# Patient Record
Sex: Female | Born: 1990 | Race: Black or African American | Hispanic: No | Marital: Single | Smoking: Never smoker
Health system: Southern US, Community
[De-identification: ages and names within clinical notes are randomized; demographics above are authoritative.]

## PROBLEM LIST (undated history)

## (undated) DIAGNOSIS — D259 Leiomyoma of uterus, unspecified: Secondary | ICD-10-CM

## (undated) DIAGNOSIS — G43909 Migraine, unspecified, not intractable, without status migrainosus: Secondary | ICD-10-CM

## (undated) DIAGNOSIS — F410 Panic disorder [episodic paroxysmal anxiety] without agoraphobia: Secondary | ICD-10-CM

## (undated) HISTORY — PX: NO PAST SURGERIES: SHX2092

---

## 2015-10-08 ENCOUNTER — Emergency Department (HOSPITAL_COMMUNITY)
Admission: EM | Admit: 2015-10-08 | Discharge: 2015-10-08 | Disposition: A | Discharge status: Home / Self Care | Payer: Self-pay | Attending: Emergency Medicine | Admitting: Emergency Medicine

## 2015-10-08 ENCOUNTER — Encounter (HOSPITAL_COMMUNITY): Payer: Self-pay | Admitting: Emergency Medicine

## 2015-10-08 ENCOUNTER — Emergency Department (HOSPITAL_COMMUNITY): Payer: Self-pay

## 2015-10-08 DIAGNOSIS — R002 Palpitations: Secondary | ICD-10-CM | POA: Insufficient documentation

## 2015-10-08 DIAGNOSIS — Z86018 Personal history of other benign neoplasm: Secondary | ICD-10-CM | POA: Insufficient documentation

## 2015-10-08 DIAGNOSIS — F419 Anxiety disorder, unspecified: Secondary | ICD-10-CM | POA: Insufficient documentation

## 2015-10-08 DIAGNOSIS — Z8679 Personal history of other diseases of the circulatory system: Secondary | ICD-10-CM | POA: Insufficient documentation

## 2015-10-08 DIAGNOSIS — Z3202 Encounter for pregnancy test, result negative: Secondary | ICD-10-CM | POA: Insufficient documentation

## 2015-10-08 DIAGNOSIS — R0602 Shortness of breath: Secondary | ICD-10-CM | POA: Insufficient documentation

## 2015-10-08 HISTORY — DX: Leiomyoma of uterus, unspecified: D25.9

## 2015-10-08 HISTORY — DX: Panic disorder (episodic paroxysmal anxiety): F41.0

## 2015-10-08 HISTORY — DX: Migraine, unspecified, not intractable, without status migrainosus: G43.909

## 2015-10-08 LAB — URINALYSIS, ROUTINE W REFLEX MICROSCOPIC
Bilirubin Urine: NEGATIVE
Glucose, UA: NEGATIVE mg/dL
KETONES UR: NEGATIVE mg/dL
Leukocytes, UA: NEGATIVE
Nitrite: NEGATIVE
PROTEIN: NEGATIVE mg/dL
SPECIFIC GRAVITY, URINE: 1.019 (ref 1.005–1.030)
pH: 6 (ref 5.0–8.0)

## 2015-10-08 LAB — T4, FREE: FREE T4: 0.86 ng/dL (ref 0.61–1.12)

## 2015-10-08 LAB — URINE MICROSCOPIC-ADD ON

## 2015-10-08 LAB — I-STAT CHEM 8, ED
BUN: 5 mg/dL — AB (ref 6–20)
CHLORIDE: 104 mmol/L (ref 101–111)
Calcium, Ion: 1.21 mmol/L (ref 1.12–1.23)
Creatinine, Ser: 0.8 mg/dL (ref 0.44–1.00)
GLUCOSE: 101 mg/dL — AB (ref 65–99)
HCT: 36 % (ref 36.0–46.0)
Hemoglobin: 12.2 g/dL (ref 12.0–15.0)
Potassium: 3.3 mmol/L — ABNORMAL LOW (ref 3.5–5.1)
Sodium: 140 mmol/L (ref 135–145)
TCO2: 22 mmol/L (ref 0–100)

## 2015-10-08 LAB — TSH: TSH: 3.792 u[IU]/mL (ref 0.350–4.500)

## 2015-10-08 LAB — POC URINE PREG, ED: PREG TEST UR: NEGATIVE

## 2015-10-08 MED ORDER — METOPROLOL TARTRATE 25 MG PO TABS: 100.0000 mg | ORAL_TABLET | Freq: Two times a day (BID) | ORAL | Status: DC

## 2015-10-08 MED ORDER — POTASSIUM CHLORIDE CRYS ER 20 MEQ PO TBCR
40.0000 meq | EXTENDED_RELEASE_TABLET | Freq: Once | ORAL | Status: AC
  Administered 2015-10-08: 40 meq via ORAL
  Filled 2015-10-08: qty 2

## 2015-10-08 MED ORDER — METOPROLOL TARTRATE 1 MG/ML IV SOLN
5.0000 mg | Freq: Once | INTRAVENOUS | Status: AC
  Administered 2015-10-08: 5 mg via INTRAVENOUS
  Filled 2015-10-08: qty 5

## 2015-10-08 NOTE — ED Notes (Signed)
Patient is alert and oriented x3.  She was given DC instructions and follow up visit instructions.  Patient gave verbal understanding. She was DC ambulatory under her own power to home.  V/S stable.  He was not showing any signs of distress on DC 

## 2015-10-08 NOTE — ED Notes (Signed)
Patient is drinking water from water bottle to help try to urinate per md orders

## 2015-10-08 NOTE — ED Provider Notes (Signed)
CSN: [id number]     Arrival date & time 10/08/15  0202 History   First MD Initiated Contact with Patient 10/08/15 0206     Chief Complaint  Patient presents with  . Palpitations     (Consider location/radiation/quality/duration/timing/severity/associated sxs/prior Treatment) HPI   Patient is a 25 year old female with history of uterine fibroids, migraines and panic attacks, she presents to emergency department with 6 hours of rapid heart rate that began tonight at 8 pm.  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  She states she was able to feel it throughout her whole body, and she describes it as if "her heart is going to pound through her chest."  She has associated intermittent shooting CP located on the sides of her chest, without radiation and w/o aggrivating or alleviating factors, not reproducible with exertion.  She denies SOB, and states with other anxiety attacks the patient felt short of breath and would hyperventilate, she did not have the symptoms today.  She denies any lower extremity edema, history of blood clots, recent travel, stasis or injury, lightheadedness. Multiple family members have history of thyroid disease, she has her thyroid tested annually.  She denies any recent illness.  She denies possibility of dehydration, states that she drinks bottled water throughout the day.  She has a history of abnormal periods stating that she had 2 periods in the month of December, 1 at the beginning of January and she just finished her period this week. She currently rates anxiety 5/10.  She denies SI, HI and AVH.  She does not take any meds for anxiety.    Past Medical History  Diagnosis Date  . Panic attack   . Migraine   . Fibroid, uterine    History reviewed. No pertinent past surgical history. History reviewed. No pertinent family history. Social History  Substance Use Topics  . Smoking status: Never Smoker   .  Smokeless tobacco: None  . Alcohol Use: No   OB History    No data available     Review of Systems  Constitutional: Negative.   HENT: Negative.   Eyes: Negative.   Respiratory: Positive for shortness of breath. Negative for cough and wheezing.   Cardiovascular: Positive for palpitations. Negative for leg swelling.  Gastrointestinal: Negative.   Endocrine: Negative.   Genitourinary: Negative.   Musculoskeletal: Negative.   Skin: Negative.   Neurological: Negative for dizziness, syncope, weakness, light-headedness and headaches.  Hematological: Negative.   Psychiatric/Behavioral: Negative for suicidal ideas, hallucinations, self-injury and dysphoric mood. The patient is nervous/anxious.     Allergies  Review of patient's allergies indicates no known allergies.  Home Medications   Prior to Admission medications   Medication Sig Start Date End Date Taking? Authorizing Provider  metoprolol (LOPRESSOR) 25 MG tablet Take 4 tablets (100 mg total) by mouth 2 (two) times daily. 10/08/15   Jola Schmidt, MD   BP 131/83 mmHg  Pulse 67  Temp(Src) 97.4 F (36.3 C) (Axillary)  Resp 10  Ht 5' (1.524 m)  Wt 115.214 kg  BMI 49.61 kg/m2  SpO2 100%  LMP 10/06/2015 Physical Exam  Constitutional: She is oriented to person, place, and time. She appears well-developed and well-nourished. No distress.  HENT:  Head: Normocephalic and atraumatic.  Nose: Nose normal.  Mouth/Throat: Oropharynx is clear and moist. No oropharyngeal exudate.  Eyes: Conjunctivae and EOM are normal. Pupils are equal, round, and reactive  to light. Right eye exhibits no discharge. Left eye exhibits no discharge. No scleral icterus.  Neck: Normal range of motion. No JVD present. No tracheal deviation present. No thyromegaly present.  Cardiovascular: Normal rate, regular rhythm, normal heart sounds and intact distal pulses.  Exam reveals no gallop and no friction rub.   No murmur heard. Pulses:      Radial pulses are 2+  on the right side, and 2+ on the left side.       Dorsalis pedis pulses are 2+ on the right side, and 2+ on the left side.  No LE edema, no erythema, no tenderness with palpation of bilateral calves  Pulmonary/Chest: Effort normal and breath sounds normal. No respiratory distress. She has no wheezes. She has no rales. She exhibits no tenderness.  Abdominal: Soft. Bowel sounds are normal. She exhibits no distension and no mass. There is no tenderness. There is no rebound and no guarding.  Musculoskeletal: Normal range of motion. She exhibits no edema or tenderness.  Lymphadenopathy:    She has no cervical adenopathy.  Neurological: She is alert and oriented to person, place, and time. She has normal reflexes. No cranial nerve deficit. She exhibits normal muscle tone. Coordination normal.  Skin: Skin is warm and dry. No rash noted. She is not diaphoretic. No erythema. No pallor.  Psychiatric: She has a normal mood and affect. Her speech is normal and behavior is normal. Judgment and thought content normal. Her mood appears not anxious. Cognition and memory are normal. She does not exhibit a depressed mood.  Nursing note and vitals reviewed.   ED Course  Procedures (including critical care time) Labs Review Labs Reviewed  URINALYSIS, ROUTINE W REFLEX MICROSCOPIC (NOT AT [facility]) - Abnormal; Notable for the following:    Hgb urine dipstick TRACE (*)    All other components within normal limits  URINE MICROSCOPIC-ADD ON - Abnormal; Notable for the following:    Squamous Epithelial / LPF 0-5 (*)    Bacteria, UA RARE (*)    All other components within normal limits  I-STAT CHEM 8, ED - Abnormal; Notable for the following:    Potassium 3.3 (*)    BUN 5 (*)    Glucose, Bld 101 (*)    All other components within normal limits  TSH  T4, FREE  T3, FREE  POC URINE PREG, ED    Imaging Review Dg Chest 2 View  10/08/2015  CLINICAL DATA:  Chest tightness and palpitations. EXAM: CHEST  2 VIEW  COMPARISON:  None. FINDINGS: The heart is at the upper limits normal in size. The lungs are clear. Pulmonary vasculature is normal. No consolidation, pleural effusion, or pneumothorax. No acute osseous abnormalities are seen. IMPRESSION: Heart at the upper limits normal in size. Further evaluation could be considered with echocardiogram. No localizing process. Electronically Signed   By: Jeb Levering M.D.   On: 10/08/2015 03:00   I have personally reviewed and evaluated these images and lab results as part of my medical decision-making.   EKG Interpretation   Date/Time:  Tuesday October 08 2015 02:32:45 EST Ventricular Rate:  94 PR Interval:  142 QRS Duration: 96 QT Interval:  359 QTC Calculation: 449 R Axis:   -48 Text Interpretation:  Sinus or ectopic atrial rhythm LVH with secondary  repolarization abnormality Anterior ST elevation, probably due to LVH No  old tracing to compare Confirmed by CAMPOS  MD, KEVIN ([id number]) on 10/08/2015  3:22:46 AM      MDM  Pt with palpitations described as rapid heart rate that feels like "its going to pound through her chest" felt all over her body, she states she does not feel particularly anxious, sx began at 8pm, lasting 6 hours at the time of presentation, and have not subsided despite laying down and resting.  Will obtain EKG, chest x-ray. She has uterine fibroids and abnormal uterine bleeding, will check basic labs for anemia, will obtain thyroid labs, patient was told that they will not result tonight and she will need to follow up on them with her PCP.  Pt had abnormal EKG and CXR pertinent for heart being at upper limits of normal.  TSH was normal.  Pt was mildly hypokalemic.  The pt was presented to Dr. Venora Maples, who personally saw and evaluated the pt, he ordered IV lopressor and potassium replacement, she was watched on the monitor, and then Dr. Venora Maples discharged her with Cardiology follow up.  Please see his documentation for further  details.  Final diagnoses:  Palpitations      Delsa Grana, PA-C 10/08/15 [id number]  Jola Schmidt, MD 10/08/15 [phone]

## 2015-10-08 NOTE — Discharge Instructions (Signed)

## 2015-10-08 NOTE — ED Notes (Signed)
Pt states that states that she has had panic attacks in the past but they have not lasted this long. States she feels like her heart is racing and shaky. Alert and oriented.

## 2015-10-09 LAB — T3, FREE: T3 FREE: 3.2 pg/mL (ref 2.0–4.4)

## 2015-10-10 DIAGNOSIS — G43909 Migraine, unspecified, not intractable, without status migrainosus: Secondary | ICD-10-CM | POA: Insufficient documentation

## 2015-10-10 DIAGNOSIS — D259 Leiomyoma of uterus, unspecified: Secondary | ICD-10-CM | POA: Insufficient documentation

## 2015-10-10 DIAGNOSIS — F410 Panic disorder [episodic paroxysmal anxiety] without agoraphobia: Secondary | ICD-10-CM | POA: Insufficient documentation

## 2015-10-11 ENCOUNTER — Ambulatory Visit (INDEPENDENT_AMBULATORY_CARE_PROVIDER_SITE_OTHER): Payer: Self-pay | Admitting: Cardiology

## 2015-10-11 ENCOUNTER — Encounter: Payer: Self-pay | Admitting: Cardiology

## 2015-10-11 ENCOUNTER — Telehealth: Payer: Self-pay

## 2015-10-11 VITALS — BP 112/72 | HR 61 | Ht 60.0 in | Wt 246.0 lb

## 2015-10-11 DIAGNOSIS — R000 Tachycardia, unspecified: Secondary | ICD-10-CM

## 2015-10-11 MED ORDER — METOPROLOL TARTRATE 25 MG PO TABS: 50.0000 mg | ORAL_TABLET | Freq: Two times a day (BID) | ORAL | Status: AC

## 2015-10-11 NOTE — Telephone Encounter (Signed)
Called patient about medication change. Patient verbalized understanding and had no questions or concerns at this time.

## 2015-10-11 NOTE — Patient Instructions (Signed)
Medication Instructions:  The current medical regimen is effective;  continue present plan and medications.  Testing/Procedures: Your physician has requested that you have an echocardiogram. Echocardiography is a painless test that uses sound waves to create images of your heart. It provides your doctor with information about the size and shape of your heart and how well your heart's chambers and valves are working. This procedure takes approximately one hour. There are no restrictions for this procedure.  Follow-Up: Follow up with Dr Meda Coffee.  If you need a refill on your cardiac medications before your next appointment, please call your pharmacy.  Thank you for choosing [facility]!!

## 2015-10-11 NOTE — Progress Notes (Signed)
Cardiology Office Note  NEW PATIENT VISIT   Date:  10/11/2015   ID:  Karen Adkins, DOB 08-24-1991, MRN [id number]  PCP:  No PCP Per Patient  Cardiologist:  New Dr. Meda Adkins    Chief Complaint  Patient presents with  . Palpitations      History of Present Illness: Karen Adkins is a 25 y.o. female who presents for post ER visit for tachycardia and chest pain.   Pt seen in ER 10/08/15 for racing HR and feeling shaky. Her EKG revealed ST vs. Atrial tach though HR was not significantly elevated.  LVH.  TSH 3.792, Free T4 0.86, free T3 3.2, K+ 3.3.  Her K+ was replaced and she was given IV lopressor and HR slowed symptoms resolved.  She did have occ sharp pain in her chest in the ER.  She is now on Lopressor 100 BID with no further episodes.  No chest pain though after taking meds she may have sharp pain.   She has cut out sodas, no tobacco.  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    Past Medical History  Diagnosis Date  . Panic attack   . Migraine   . Fibroid, uterine     Past Surgical History  Procedure Laterality Date  . No past surgeries       Current Outpatient Prescriptions  Medication Sig Dispense Refill  . metoprolol (LOPRESSOR) 25 MG tablet Take 4 tablets (100 mg total) by mouth 2 (two) times daily. 60 tablet 0   No current facility-administered medications for this visit.    Allergies:   Review of patient's allergies indicates no known allergies.    Social History:  The patient  reports that she has never smoked. She does not have any smokeless tobacco history on file. She reports that she does not drink alcohol or use illicit drugs.   Family History:  The patient's family history includes Diabetes in her mother; Heart failure in her maternal grandmother; Hypertension in her mother; Hyperthyroidism in her mother; Sleep apnea in her father; Thyroid disease in her mother.  Her grandfather had a heart transplant at 68 and then died at 108.     ROS:  General:no colds or fevers, no weight changes-unless lower , no herbal meds no OTC meds Skin:no rashes or ulcers HEENT:no blurred vision, no congestion CV:see HPI PUL:see HPI GI:no diarrhea constipation or melena, no indigestion GU:no hematuria, no dysuria MS:no joint pain, no claudication Neuro:no syncope, no lightheadedness Endo:no diabetes, no thyroid disease  Wt Readings from Last 3 Encounters:  10/11/15 246 lb (111.585 kg)  10/08/15 254 lb (115.214 kg)     PHYSICAL EXAM: VS:  BP 112/72 mmHg  Pulse 61  Ht 5' (1.524 m)  Wt 246 lb (111.585 kg)  BMI 48.04 kg/m2  LMP 10/06/2015 , BMI Body mass index is 48.04 kg/(m^2). General:Pleasant affect, NAD Skin:Warm and dry, brisk capillary refill HEENT:normocephalic, sclera clear, mucus membranes moist Neck:supple, no JVD, no bruits  Heart:S1S2 RRR without murmur, gallup, rub or click Lungs:clear without rales, rhonchi, or wheezes [id number], non tender, + BS, do not palpate liver spleen or masses Ext:no lower ext edema, 2+ pedal pulses, 2+ radial pulses Neuro:alert and oriented, MAE, follows commands, + facial symmetry    EKG:  EKG is ordered today. The ekg ordered today demonstrates SR at 67 LVH    Recent Labs: 10/08/2015: BUN 5*; Creatinine, Ser 0.80; Hemoglobin 12.2; Potassium 3.3*; Sodium 140; TSH 3.792    Lipid  Panel No results found for: CHOL, TRIG, HDL, CHOLHDL, VLDL, LDLCALC, LDLDIRECT     Other studies Reviewed: Additional studies/ records that were reviewed today include: ER visits.    ASSESSMENT AND PLAN:  1.  In appropriate ST though on EKG not tachycardic but treated with BB.  Reviewed with Dr. Meda Adkins and we will proceed with echo she will then follow up with Dr. Meda Adkins for will decrease BB to 50 mg BID.    Current medicines are reviewed with the patient today.  The patient Has no concerns regarding medicines.  The following changes have been made:  See above Labs/ tests ordered today  include:see above  Disposition:   FU:  see above  Signed, Karen Serge, NP  10/11/2015 2:02 PM    [facility] [address], [place], [place], [place] Phone: [phone]; Fax: [phone]  The patient was seen, examined and discussed with Karen Kicks, NP and I agree with the above.  Karen Adkins 10/11/2015

## 2015-10-11 NOTE — Telephone Encounter (Signed)
-----   Message from Isaiah Serge, NP sent at 10/11/2015  3:06 PM EST ----- Please have pt take Lopressor at 50 mg twice a day for now.  thanks

## 2015-10-16 ENCOUNTER — Other Ambulatory Visit: Payer: Self-pay

## 2015-10-16 ENCOUNTER — Ambulatory Visit (HOSPITAL_COMMUNITY): Payer: Self-pay | Attending: Cardiovascular Disease

## 2015-10-16 DIAGNOSIS — R000 Tachycardia, unspecified: Secondary | ICD-10-CM | POA: Insufficient documentation

## 2015-10-16 DIAGNOSIS — I517 Cardiomegaly: Secondary | ICD-10-CM | POA: Insufficient documentation

## 2015-11-29 ENCOUNTER — Ambulatory Visit (INDEPENDENT_AMBULATORY_CARE_PROVIDER_SITE_OTHER): Payer: Self-pay | Admitting: Cardiology

## 2015-11-29 ENCOUNTER — Encounter: Payer: Self-pay | Admitting: Cardiology

## 2015-11-29 VITALS — BP 114/62 | HR 101 | Ht 60.0 in | Wt 248.0 lb

## 2015-11-29 DIAGNOSIS — I517 Cardiomegaly: Secondary | ICD-10-CM

## 2015-11-29 DIAGNOSIS — R000 Tachycardia, unspecified: Secondary | ICD-10-CM

## 2015-11-29 DIAGNOSIS — I10 Essential (primary) hypertension: Secondary | ICD-10-CM

## 2015-11-29 MED ORDER — PROPRANOLOL HCL 20 MG PO TABS: 20.0000 mg | ORAL_TABLET | Freq: Two times a day (BID) | ORAL | Status: AC

## 2015-11-29 NOTE — Patient Instructions (Signed)
Medication Instructions:  STOP METOPROLOL START PROPRANOLOL 20 MG  TWICE DAILY   Labwork: NONE  Testing/Procedures: NONE  Follow-Up: Your physician recommends that you schedule a follow-up appointment in:  2 MONTHS  WITH  DR Meda Coffee   Any Other Special Instructions Will Be Listed Below (If Applicable).     If you need a refill on your cardiac medications before your next appointment, please call your pharmacy.

## 2015-11-29 NOTE — Progress Notes (Signed)
Patient ID: Lavonya Langer, female   DOB: 10-Dec-1990, 25 y.o.   MRN: [id number]    Cardiology Office Note  Date:  11/29/2015   ID:  Storee Vettel, DOB 06/11/1991, MRN [id number]  PCP:  No PCP Per Patient  Cardiologist:  New Dr. Meda Coffee    Chief complaint: Palpitations, tachycardia   History of Present Illness: Karen Adkins is a 24 y.o. female who presents for post ER visit for tachycardia and chest pain.   Pt seen in ER 10/08/15 for racing HR and feeling shaky. Her EKG revealed ST vs. Atrial tach though HR was not significantly elevated.  LVH.  TSH 3.792, Free T4 0.86, free T3 3.2, K+ 3.3.  Her K+ was replaced and she was given IV lopressor and HR slowed symptoms resolved.  She did have occ sharp pain in her chest in the ER.  She is now on Lopressor 100 BID with no further episodes.  No chest pain though after taking meds she may have sharp pain.   She has cut out sodas, no tobacco.  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   11/29/2015 - the patient is coming after 1 months she underwent echocardiography that showed normal LVEF and mild LVH, she was encouraged to continue taking metoprolol twice daily as these would work for her inappropriate sinus tachycardia as well as her hypertension. On patient feels like her palpitations have improved however she has been experiencing significant worsening in her migraines. She denies any syncope no lower extremity edema no chest pain or shortness of breath.   Past Medical History  Diagnosis Date  . Panic attack   . Migraine   . Fibroid, uterine     Past Surgical History  Procedure Laterality Date  . No past surgeries       Current Outpatient Prescriptions  Medication Sig Dispense Refill  . metoprolol tartrate (LOPRESSOR) 25 MG tablet Take 2 tablets (50 mg total) by mouth 2 (two) times daily.     No current facility-administered medications for this visit.    Allergies:   Review of patient's allergies indicates no  known allergies.    Social History:  The patient  reports that she has never smoked. She does not have any smokeless tobacco history on file. She reports that she does not drink alcohol or use illicit drugs.   Family History:  The patient's family history includes Diabetes in her mother; Heart failure in her maternal grandmother; Hypertension in her mother; Hyperthyroidism in her mother; Sleep apnea in her father; Thyroid disease in her mother.  Her grandfather had a heart transplant at 7 and then died at 42.    ROS:  General:no colds or fevers, no weight changes-unless lower , no herbal meds no OTC meds Skin:no rashes or ulcers HEENT:no blurred vision, no congestion CV:see HPI PUL:see HPI GI:no diarrhea constipation or melena, no indigestion GU:no hematuria, no dysuria MS:no joint pain, no claudication Neuro:no syncope, no lightheadedness Endo:no diabetes, no thyroid disease  Wt Readings from Last 3 Encounters:  11/29/15 248 lb (112.492 kg)  10/11/15 246 lb (111.585 kg)  10/08/15 254 lb (115.214 kg)     PHYSICAL EXAM: VS:  BP 114/62 mmHg  Pulse 101  Ht 5' (1.524 m)  Wt 248 lb (112.492 kg)  BMI 48.43 kg/m2  SpO2 99% , BMI Body mass index is 48.43 kg/(m^2). General:Pleasant affect, NAD Skin:Warm and dry, brisk capillary refill HEENT:normocephalic, sclera clear, mucus membranes moist Neck:supple, no JVD, no bruits  Heart:S1S2 RRR without murmur, gallup, rub or click Lungs:clear without rales, rhonchi, or wheezes [id number], non tender, + BS, do not palpate liver spleen or masses Ext:no lower ext edema, 2+ pedal pulses, 2+ radial pulses Neuro:alert and oriented, MAE, follows commands, + facial symmetry  EKG:  No EKG today.  Recent Labs: 10/08/2015: BUN 5*; Creatinine, Ser 0.80; Hemoglobin 12.2; Potassium 3.3*; Sodium 140; TSH 3.792   Lipid Panel No results found for: CHOL, TRIG, HDL, CHOLHDL, VLDL, LDLCALC, LDLDIRECT   TTE: 10/16/2015 Left ventricle: The cavity size was  normal. Wall thickness was  increased in a pattern of mild LVH. Systolic function was normal.  The estimated ejection fraction was in the range of 55% to 60%.  Wall motion was normal; there were no regional wall motion  abnormalities. - Left atrium: The atrium was mildly dilated. - Atrial septum: No defect or patent foramen ovale was identified.    ASSESSMENT AND PLAN:  1.  In appropriate sinus tachycardia, that responded well to metoprolol however worsening of migraines therefore we will discontinue and start propranolol 20 mg by mouth twice a day  2. Mild concentric LVH - propranolol should be effective in treating her hypertension as well as reversing LVH.  Follow-up in 2 months  Signed, Dorothy Spark, MD  11/29/2015 8:51 AM

## 2016-01-29 ENCOUNTER — Ambulatory Visit: Payer: Self-pay | Admitting: Cardiology

## 2016-01-30 ENCOUNTER — Encounter: Payer: Self-pay | Admitting: Cardiology

## 2017-04-29 IMAGING — CR DG CHEST 2V
2 series · 2 of 2 positions shown · non-contrast
Comparison: None.

CLINICAL DATA: Chest tightness and palpitations.

EXAM:
CHEST  2 VIEW

[w chest pa]
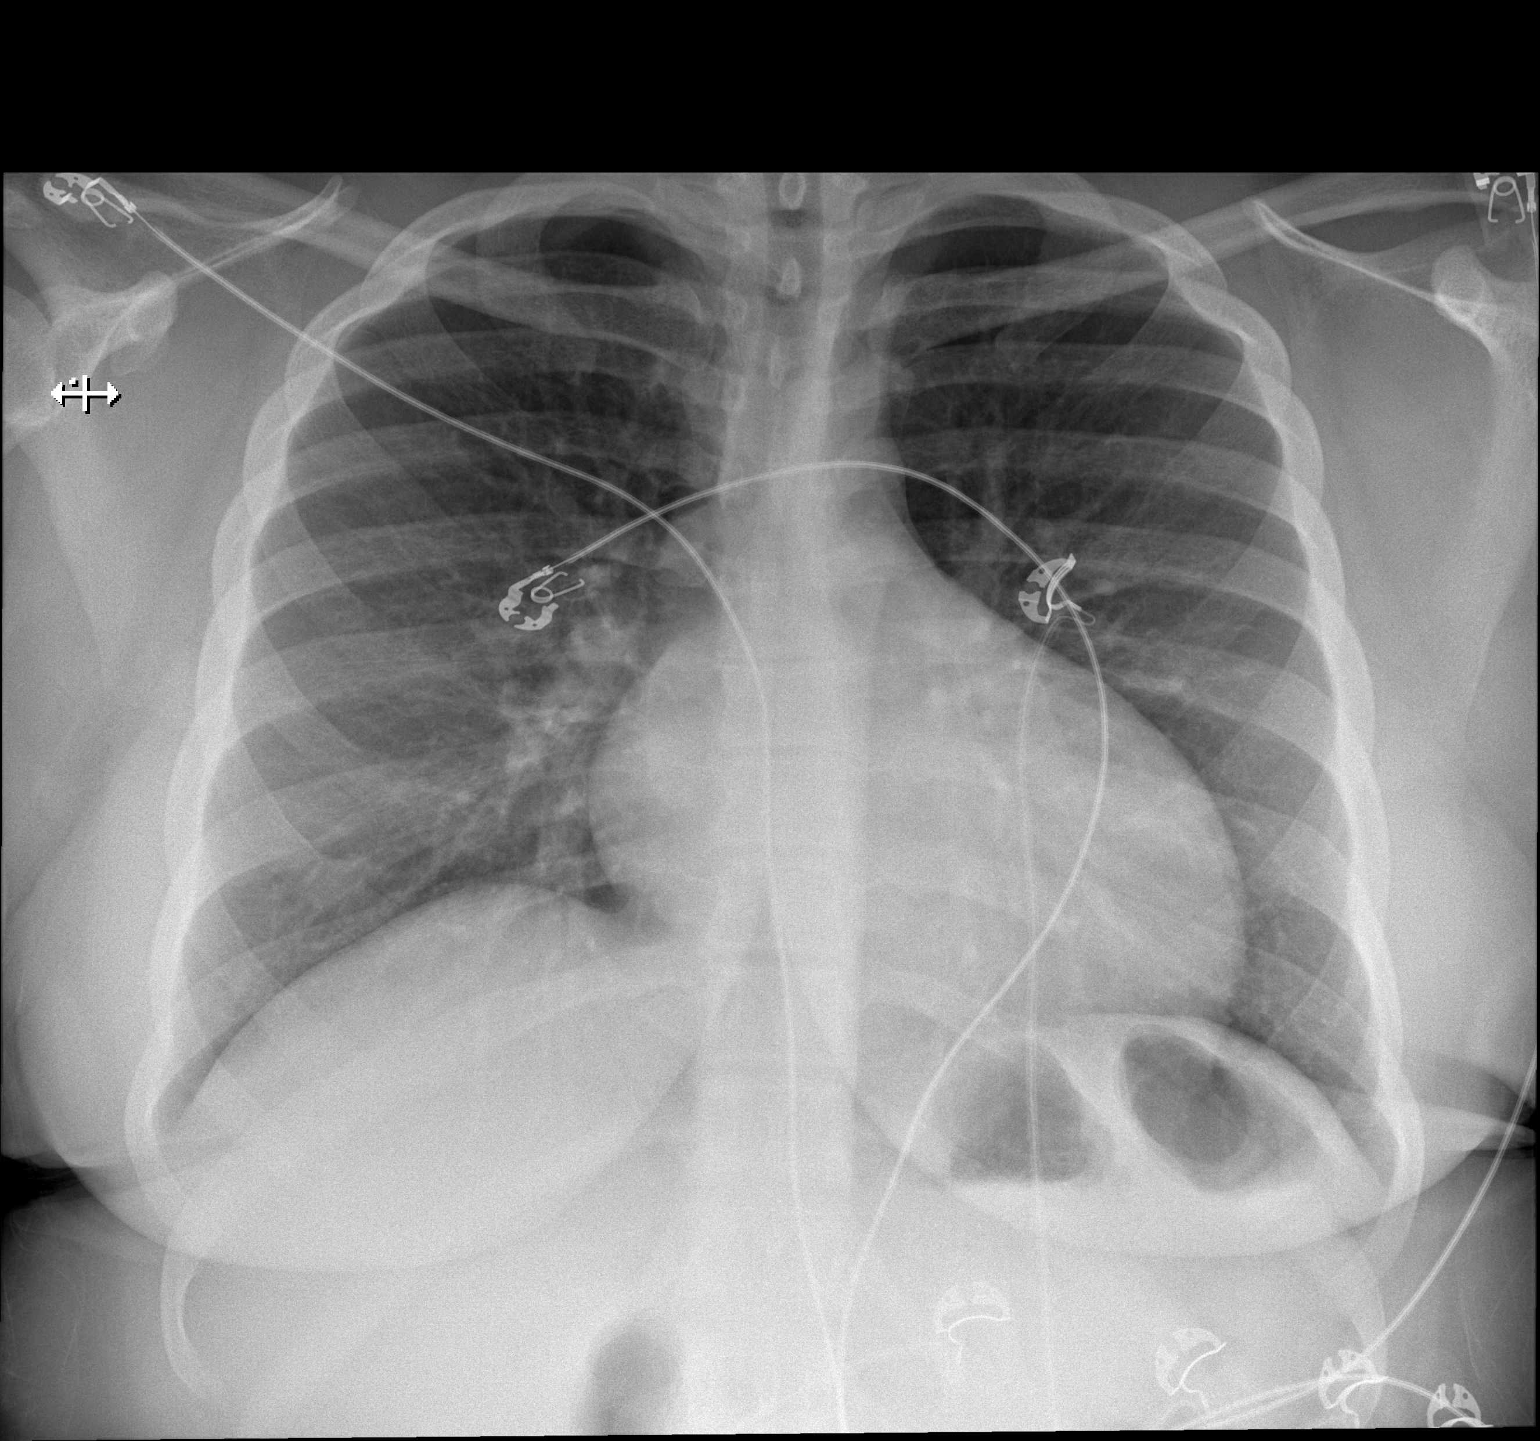

[w chest lat]
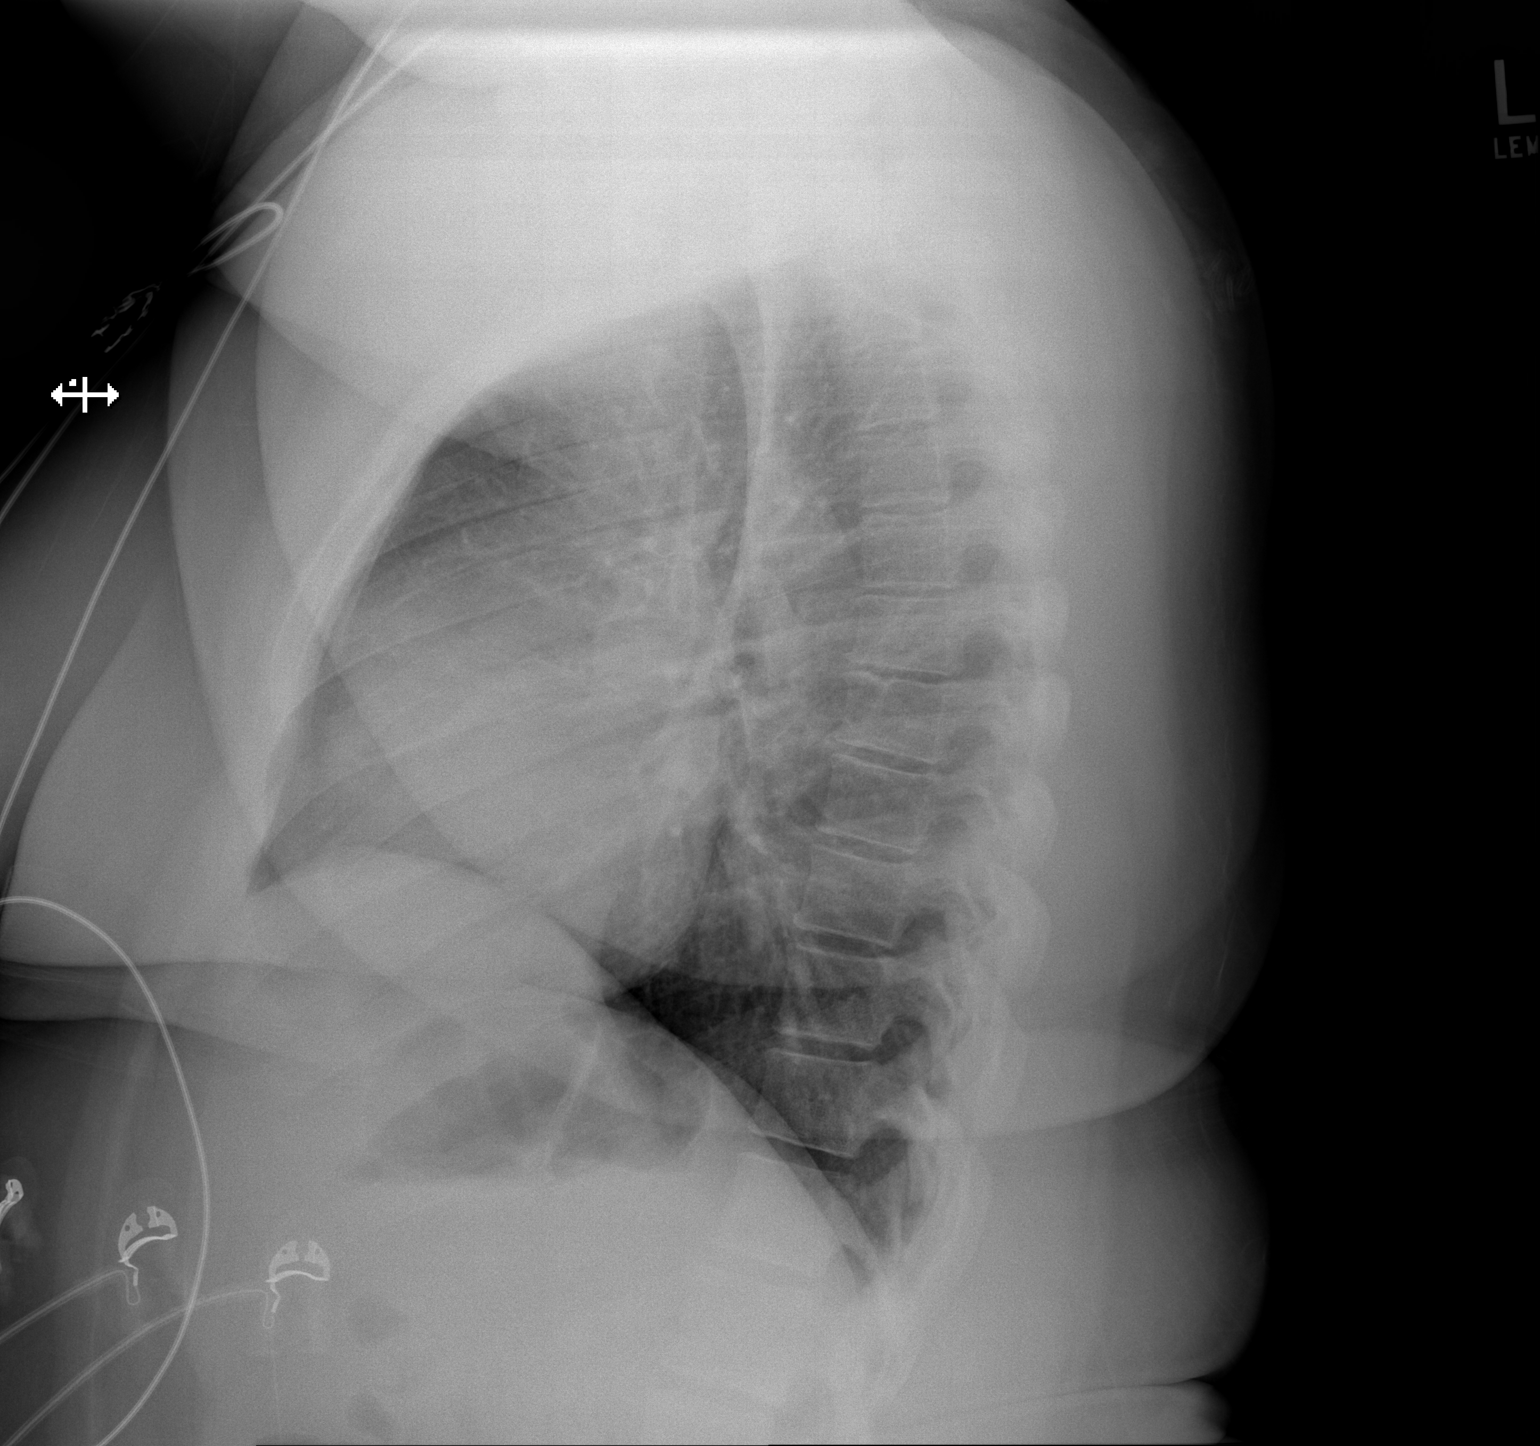

[2 of 2 positions shown; findings below may reference images not displayed]

FINDINGS: The heart is at the upper limits normal in size. The lungs are
clear. Pulmonary vasculature is normal. No consolidation, pleural
effusion, or pneumothorax. No acute osseous abnormalities are seen.
IMPRESSION: Heart at the upper limits normal in size. Further evaluation could
be considered with echocardiogram.

No localizing process.
